# Patient Record
Sex: Female | Born: 1980 | Race: White | Hispanic: No | Marital: Married | Smoking: Never smoker
Health system: Southern US, Community
[De-identification: ages and names within clinical notes are randomized; demographics above are authoritative.]

## PROBLEM LIST (undated history)

## (undated) HISTORY — PX: DILATION AND CURETTAGE OF UTERUS: SHX78

## (undated) HISTORY — PX: WISDOM TOOTH EXTRACTION: SHX21

---
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
## 2001-08-23 ENCOUNTER — Other Ambulatory Visit: Admission: RE | Admit: 2001-08-23 | Discharge: 2001-08-23 | Payer: Self-pay | Admitting: Obstetrics and Gynecology

## 2002-10-01 ENCOUNTER — Other Ambulatory Visit: Admission: RE | Admit: 2002-10-01 | Discharge: 2002-10-01 | Payer: Self-pay | Admitting: Obstetrics & Gynecology

## 2002-10-01 ENCOUNTER — Other Ambulatory Visit: Admission: RE | Admit: 2002-10-01 | Discharge: 2002-10-01 | Payer: Self-pay | Admitting: Obstetrics and Gynecology

## 2003-01-05 ENCOUNTER — Encounter: Payer: Self-pay | Admitting: Emergency Medicine

## 2003-01-05 ENCOUNTER — Emergency Department (HOSPITAL_COMMUNITY): Admission: EM | Admit: 2003-01-05 | Discharge: 2003-01-05 | Payer: Self-pay | Admitting: Emergency Medicine

## 2003-06-04 ENCOUNTER — Inpatient Hospital Stay (HOSPITAL_COMMUNITY): Admission: AD | Admit: 2003-06-04 | Discharge: 2003-06-07 | Payer: Self-pay | Admitting: *Deleted

## 2004-08-11 ENCOUNTER — Ambulatory Visit (HOSPITAL_COMMUNITY): Admission: RE | Admit: 2004-08-11 | Discharge: 2004-08-11 | Payer: Self-pay | Admitting: Obstetrics & Gynecology

## 2004-10-27 ENCOUNTER — Observation Stay (HOSPITAL_COMMUNITY): Admission: AD | Admit: 2004-10-27 | Discharge: 2004-10-28 | Payer: Self-pay | Admitting: Obstetrics

## 2004-12-23 ENCOUNTER — Inpatient Hospital Stay (HOSPITAL_COMMUNITY): Admission: AD | Admit: 2004-12-23 | Discharge: 2004-12-26 | Payer: Self-pay | Admitting: Obstetrics & Gynecology

## 2007-10-08 ENCOUNTER — Ambulatory Visit (HOSPITAL_COMMUNITY): Admission: AD | Admit: 2007-10-08 | Discharge: 2007-10-08 | Payer: Self-pay | Admitting: Obstetrics and Gynecology

## 2007-10-08 ENCOUNTER — Encounter (INDEPENDENT_AMBULATORY_CARE_PROVIDER_SITE_OTHER): Payer: Self-pay | Admitting: Obstetrics and Gynecology

## 2008-09-26 ENCOUNTER — Other Ambulatory Visit: Admission: RE | Admit: 2008-09-26 | Discharge: 2008-09-26 | Payer: Self-pay | Admitting: Obstetrics & Gynecology

## 2009-01-22 ENCOUNTER — Encounter (INDEPENDENT_AMBULATORY_CARE_PROVIDER_SITE_OTHER): Payer: Self-pay | Admitting: Interventional Radiology

## 2009-01-22 ENCOUNTER — Other Ambulatory Visit: Admission: RE | Admit: 2009-01-22 | Discharge: 2009-01-22 | Payer: Self-pay | Admitting: Interventional Radiology

## 2009-01-22 ENCOUNTER — Encounter: Admission: RE | Admit: 2009-01-22 | Discharge: 2009-01-22 | Payer: Self-pay | Admitting: Endocrinology

## 2010-02-09 ENCOUNTER — Emergency Department (HOSPITAL_COMMUNITY): Admission: EM | Admit: 2010-02-09 | Discharge: 2010-02-09 | Payer: Self-pay | Admitting: Emergency Medicine

## 2010-02-24 ENCOUNTER — Emergency Department (HOSPITAL_COMMUNITY): Admission: EM | Admit: 2010-02-24 | Discharge: 2010-02-24 | Payer: Self-pay | Admitting: Emergency Medicine

## 2010-06-29 ENCOUNTER — Inpatient Hospital Stay (HOSPITAL_COMMUNITY): Admission: AD | Admit: 2010-06-29 | Discharge: 2010-07-01 | Payer: Self-pay | Admitting: Obstetrics and Gynecology

## 2010-09-11 ENCOUNTER — Encounter: Admission: RE | Admit: 2010-09-11 | Discharge: 2010-09-11 | Payer: Self-pay | Admitting: Internal Medicine

## 2010-12-06 ENCOUNTER — Encounter: Payer: Self-pay | Admitting: Endocrinology

## 2010-12-06 ENCOUNTER — Encounter: Payer: Self-pay | Admitting: Obstetrics & Gynecology

## 2011-01-28 LAB — CBC
HCT: 35.3 % — ABNORMAL LOW (ref 36.0–46.0)
Hemoglobin: 12 g/dL (ref 12.0–15.0)
MCHC: 34.1 g/dL (ref 30.0–36.0)
MCHC: 34.1 g/dL (ref 30.0–36.0)
MCV: 89.4 fL (ref 78.0–100.0)
Platelets: 215 10*3/uL (ref 150–400)
Platelets: 244 10*3/uL (ref 150–400)
RBC: 3.71 MIL/uL — ABNORMAL LOW (ref 3.87–5.11)
RBC: 3.95 MIL/uL (ref 3.87–5.11)
RDW: 15.1 % (ref 11.5–15.5)
WBC: 6.3 10*3/uL (ref 4.0–10.5)
WBC: 9.6 10*3/uL (ref 4.0–10.5)

## 2011-01-28 LAB — RPR: RPR Ser Ql: NONREACTIVE

## 2011-03-30 NOTE — H&P (Signed)
NAME:  Ashley Proctor, ARLEN NO.:  [id number]   MEDICAL RECORD NO.:  [id number]           PATIENT TYPE:   LOCATION:                                FACILITY:  [facility]   PHYSICIAN:  Osborn Coho, M.D.   DATE OF BIRTH:  12-May-1981   DATE OF ADMISSION:  DATE OF DISCHARGE:                              HISTORY & PHYSICAL   HISTORY OF PRESENT ILLNESS:  Ms. Ashley Proctor is a 30 year old married white  female para 4-0-1-4 who presents for a D&C because of an abnormal  pregnancy.  The patient initially presented to [facility]  on August 30, 2007 for a new OB care but developed first trimester  bleeding and had a pelvic ultrasound.  The pelvic ultrasound done on  October 20 showed a 8-week gestational sac with an enlarged yolk sac of  1 cm but no fetal pole or fetal heart rate.  The sac was noted to be  collapsing.  Her quantitative HCG at that time was 30,419.1.  She had a  blood type that was O+ and her gonorrhea and chlamydia cultures were  both negative.  The patient had a follow-up quantitative HCG on October  28 which was 1679.1 and on September 21, 2007 which was 492.5.  The  patient went on to report a heavy bleeding episode which lasted for 3  days starting on October 28 during which time she change a pad every  hour, and this was followed by 7 days of what she described as a  menstrual-like flow.  Initially, her cramping was intense, but it later  diminished.  On October 03, 2007, the patient had another quantitative  HCG, and her level was noted to have increased to 806.4.  The patient  was again given an ultrasound on November 21 which showed a mass of  mixed echogenicity filling the gestational sac, and there was also  positive color Doppler flow noted.  Ovaries were within normal limits  bilaterally.  There was no free fluid in the cul-de-sac, and no other  pelvic masses were noted.  A quantitative HCG was drawn and is currently  pending.  The patient denies  any bleeding since her initial episode  which ended 2 weeks ago.  She has not had any unprotected intercourse  since her bleeding stopped but admits to one occasion where a condom  came off.  The patient has had a single fleeting pain in her lower  pelvic area 1 week ago (right lower quadrant), but no further bleeding,  fever, nausea, vomiting, diarrhea, urinary tract symptoms or vaginitis  symptoms.  The patient is scheduled to have a repeat quantitative HCG  and pelvic ultrasound on November 23, and if her findings at that time  remain consistent with her most recent ultrasound, then the patient will  proceed with a D&C.   PAST MEDICAL HISTORY:  OB HISTORY:  Gravida 5, para 4-0-1-4.  The  patient has a history of preterm labor.  GYN HISTORY:  Menarche at 30 years old, last menstrual period  approximately June 22, 2007.  She uses condoms for  contraception.  Denies any history of sexually transmitted diseases or abnormal Pap  smears.  The patient's last normal Pap smear was 2 years ago.  MEDICAL HISTORY:  Anemia and wrist fracture.  SURGICAL HISTORY:  Is negative.   FAMILY HISTORY:  Liver cancer and bipolar disorder.   SOCIAL HISTORY:  The patient is married, and she is a Consulting civil engineer.   HABITS:  She does not use alcohol or tobacco.   CURRENT MEDICATIONS:  None.   She has no known drug allergies.   REVIEW OF SYSTEMS:  The patient denies chest pain, shortness of breath,  headache, vision changes, and except as mentioned in history of present  illness, the patient's review of systems is otherwise negative.   PHYSICAL EXAMINATION:  Blood pressure 120/90.  Weight is 162-1/2 pounds.  GENERAL EXAM:  Abdomen is without tenderness, masses, or organomegaly.  BACK:  No CVA tenderness.  PELVIC EXAM:  EGBUS is normal.  Vagina is normal.  Cervix is nontender  without lesions.  Uterus appears normal size, shape and consistency  without tenderness.  Adnexa without tenderness or masses.    IMPRESSION:  Incomplete abortion versus abnormal pregnancy.   DISPOSITION:  A discussion was held with the patient regarding the  findings of her quantitative HCGs and pelvic ultrasound which are  consistent with an abnormal pregnancy, with one possibility being an  incomplete abortion.  The patient has consented to proceed with a D&C at  [facility] on November 23, should her follow-up  ultrasound and quantitative HCG continued to confirm these previous  findings.  The patient understands the risks associated with this  procedure which include reaction to anesthesia, damage to adjacent  organs, infection, excessive bleeding and the possibility of scarring of  the endometrium.  The patient has consented to proceed with a D&C.      [place] J. Adline Peals.      Osborn Coho, M.D.  Electronically Signed    EJP/MEDQ  D:  10/06/2007  T:  10/07/2007  Job:  [id number]

## 2011-03-30 NOTE — Op Note (Signed)
NAME:  Ashley Proctor, Ashley Proctor NO.:  [id number]   MEDICAL RECORD NO.:  [id number]          PATIENT TYPE:  [facility]   LOCATION:  [facility]                          FACILITY:  [facility]   PHYSICIAN:  Osborn Coho, M.D.   DATE OF BIRTH:  05-May-1981   DATE OF PROCEDURE:  10/08/2007  DATE OF DISCHARGE:                               OPERATIVE REPORT   PREOPERATIVE DIAGNOSIS:  Retained products of conception.   POSTOPERATIVE DIAGNOSIS:  Retained products of conception.   PROCEDURE:  Suction, dilation and curettage/dilatation and evacuation.   ATTENDING:  Osborn Coho, M.D.   ANESTHESIA:  General via LMA.   SPECIMENS TO PATHOLOGY:  Products of conception.   FLUIDS:  500 mL.   URINE OUTPUT:  Greater than 500 mL via straight cath.   ESTIMATED BLOOD LOSS:  Minimal.   COMPLICATIONS:  None.   PROCEDURE:  The patient was taken to the operating room after the risks,  benefits and alternatives were reviewed with the patient.  The patient  verbalized understanding and consent signed and witnessed.  The patient  was placed under general anesthesia and prepped and draped in the normal  sterile fashion in the dorsal lithotomy position.  A bivalve speculum  placed in the patient's vagina and a paracervical block administered  using a total of 9 mL of 2% lidocaine.  The anterior lip of the cervix  was grasped with a single-tooth tenaculum and the uterus was sounded to  approximate 8.5 cm.  The cervix was dilated for passage of the size 8  suction curette.  Suction curettage was performed and products of  conception appeared to returned with minimal to moderate amount.  Sharp  curettage was performed and a gritty texture noted.  Suction curettage  was performed once again to remove any remaining debris.  The tenaculum  was removed.  There was some bleeding at the right tenaculum site which  was made hemostatic with silver nitrate.  Count was correct.  Speculum  was removed as well.  The patient  tolerated the procedure well and was  awaiting return to the recovery room in good condition.      Osborn Coho, M.D.  Electronically Signed    AR/MEDQ  D:  10/08/2007  T:  10/09/2007  Job:  [id number]

## 2011-07-30 ENCOUNTER — Other Ambulatory Visit: Payer: Self-pay | Admitting: Internal Medicine

## 2011-07-30 DIAGNOSIS — E049 Nontoxic goiter, unspecified: Secondary | ICD-10-CM

## 2011-08-03 ENCOUNTER — Ambulatory Visit
Admission: RE | Admit: 2011-08-03 | Discharge: 2011-08-03 | Disposition: A | Discharge status: Home / Self Care | Payer: Medicaid Other | Source: Ambulatory Visit | Attending: Internal Medicine | Admitting: Internal Medicine

## 2011-08-03 DIAGNOSIS — E049 Nontoxic goiter, unspecified: Secondary | ICD-10-CM

## 2011-08-24 LAB — CBC
HCT: 35.5 — ABNORMAL LOW
Hemoglobin: 12.5
MCHC: 35.3
MCV: 88.8
Platelets: 231
RDW: 13.5

## 2012-12-14 ENCOUNTER — Other Ambulatory Visit: Payer: Self-pay | Admitting: Internal Medicine

## 2012-12-14 DIAGNOSIS — E049 Nontoxic goiter, unspecified: Secondary | ICD-10-CM

## 2012-12-19 ENCOUNTER — Other Ambulatory Visit: Payer: Medicaid Other

## 2012-12-20 ENCOUNTER — Ambulatory Visit
Admission: RE | Admit: 2012-12-20 | Discharge: 2012-12-20 | Disposition: A | Discharge status: Home / Self Care | Payer: Medicaid Other | Source: Ambulatory Visit | Attending: Internal Medicine | Admitting: Internal Medicine

## 2012-12-20 DIAGNOSIS — E049 Nontoxic goiter, unspecified: Secondary | ICD-10-CM

## 2013-11-29 ENCOUNTER — Other Ambulatory Visit: Payer: Self-pay | Admitting: Dermatology

## 2015-07-15 ENCOUNTER — Other Ambulatory Visit: Payer: Self-pay | Admitting: Internal Medicine

## 2015-07-15 DIAGNOSIS — E042 Nontoxic multinodular goiter: Secondary | ICD-10-CM

## 2015-07-22 ENCOUNTER — Ambulatory Visit
Admission: RE | Admit: 2015-07-22 | Discharge: 2015-07-22 | Disposition: A | Discharge status: Home / Self Care | Payer: Medicaid Other | Source: Ambulatory Visit | Attending: Internal Medicine | Admitting: Internal Medicine

## 2015-07-22 DIAGNOSIS — E042 Nontoxic multinodular goiter: Secondary | ICD-10-CM

## 2015-10-05 ENCOUNTER — Emergency Department (HOSPITAL_COMMUNITY)
Admission: EM | Admit: 2015-10-05 | Discharge: 2015-10-05 | Disposition: A | Discharge status: Home / Self Care | Payer: Medicaid Other | Attending: Emergency Medicine | Admitting: Emergency Medicine

## 2015-10-05 ENCOUNTER — Encounter (HOSPITAL_COMMUNITY): Payer: Self-pay | Admitting: Emergency Medicine

## 2015-10-05 ENCOUNTER — Emergency Department (HOSPITAL_COMMUNITY): Payer: Medicaid Other

## 2015-10-05 DIAGNOSIS — Y998 Other external cause status: Secondary | ICD-10-CM | POA: Insufficient documentation

## 2015-10-05 DIAGNOSIS — S6992XA Unspecified injury of left wrist, hand and finger(s), initial encounter: Secondary | ICD-10-CM | POA: Diagnosis present

## 2015-10-05 DIAGNOSIS — S0081XA Abrasion of other part of head, initial encounter: Secondary | ICD-10-CM | POA: Diagnosis not present

## 2015-10-05 DIAGNOSIS — S62631A Displaced fracture of distal phalanx of left index finger, initial encounter for closed fracture: Secondary | ICD-10-CM | POA: Insufficient documentation

## 2015-10-05 DIAGNOSIS — S60411A Abrasion of left index finger, initial encounter: Secondary | ICD-10-CM | POA: Insufficient documentation

## 2015-10-05 DIAGNOSIS — Y9389 Activity, other specified: Secondary | ICD-10-CM | POA: Insufficient documentation

## 2015-10-05 DIAGNOSIS — S0083XA Contusion of other part of head, initial encounter: Secondary | ICD-10-CM | POA: Insufficient documentation

## 2015-10-05 DIAGNOSIS — S62639A Displaced fracture of distal phalanx of unspecified finger, initial encounter for closed fracture: Secondary | ICD-10-CM

## 2015-10-05 DIAGNOSIS — Y9289 Other specified places as the place of occurrence of the external cause: Secondary | ICD-10-CM | POA: Diagnosis not present

## 2015-10-05 DIAGNOSIS — T07XXXA Unspecified multiple injuries, initial encounter: Secondary | ICD-10-CM

## 2015-10-05 LAB — POC URINE PREG, ED: Preg Test, Ur: NEGATIVE

## 2015-10-05 MED ORDER — CYCLOBENZAPRINE HCL 10 MG PO TABS: 10.0000 mg | ORAL_TABLET | Freq: Three times a day (TID) | ORAL | Status: AC | PRN

## 2015-10-05 MED ORDER — BACITRACIN ZINC 500 UNIT/GM EX OINT: 1.0000 "application " | TOPICAL_OINTMENT | Freq: Two times a day (BID) | CUTANEOUS | Status: AC

## 2015-10-05 MED ORDER — BACITRACIN ZINC 500 UNIT/GM EX OINT
TOPICAL_OINTMENT | Freq: Two times a day (BID) | CUTANEOUS | Status: DC
  Administered 2015-10-05: 1 via TOPICAL

## 2015-10-05 MED ORDER — IBUPROFEN 800 MG PO TABS: 800.0000 mg | ORAL_TABLET | Freq: Three times a day (TID) | ORAL | Status: AC | PRN

## 2015-10-05 NOTE — Discharge Instructions (Signed)
Read the information below.  Use the prescribed medication as directed.  Please discuss all new medications with your pharmacist.  You may return to the Emergency Department at any time for worsening condition or any new symptoms that concern you.   If you develop redness, swelling, pus draining from the wound, or fevers greater than 100.4, return to the ER immediately for a recheck.   Contusion A contusion is a deep bruise. Contusions are the result of a blunt injury to tissues and muscle fibers under the skin. The injury causes bleeding under the skin. The skin overlying the contusion may turn blue, purple, or yellow. Minor injuries will give you a painless contusion, but more severe contusions may stay painful and swollen for a few weeks.  CAUSES  This condition is usually caused by a blow, trauma, or direct force to an area of the body. SYMPTOMS  Symptoms of this condition include:  Swelling of the injured area.  Pain and tenderness in the injured area.  Discoloration. The area may have redness and then turn blue, purple, or yellow. DIAGNOSIS  This condition is diagnosed based on a physical exam and medical history. An X-ray, CT scan, or MRI may be needed to determine if there are any associated injuries, such as broken bones (fractures). TREATMENT  Specific treatment for this condition depends on what area of the body was injured. In general, the best treatment for a contusion is resting, icing, applying pressure to (compression), and elevating the injured area. This is often called the RICE strategy. Over-the-counter anti-inflammatory medicines may also be recommended for pain control.  HOME CARE INSTRUCTIONS   Rest the injured area.  If directed, apply ice to the injured area:  Put ice in a plastic bag.  Place a towel between your skin and the bag.  Leave the ice on for 20 minutes, 2-3 times per day.  If directed, apply light compression to the injured area using an elastic  bandage. Make sure the bandage is not wrapped too tightly. Remove and reapply the bandage as directed by your health care provider.  If possible, raise (elevate) the injured area above the level of your heart while you are sitting or lying down.  Take over-the-counter and prescription medicines only as told by your health care provider. SEEK MEDICAL CARE IF:  Your symptoms do not improve after several days of treatment.  Your symptoms get worse.  You have difficulty moving the injured area. SEEK IMMEDIATE MEDICAL CARE IF:   You have severe pain.  You have numbness in a hand or foot.  Your hand or foot turns pale or cold.   This information is not intended to replace advice given to you by your health care provider. Make sure you discuss any questions you have with your health care provider.   Document Released: 08/11/2005 Document Revised: 07/23/2015 Document Reviewed: 03/19/2015 Elsevier Interactive Patient Education 2016 Elsevier Inc.  Finger Fracture Fractures of fingers are breaks in the bones of the fingers. There are many types of fractures. There are different ways of treating these fractures. Your health care provider will discuss the best way to treat your fracture. CAUSES Traumatic injury is the main cause of broken fingers. These include:  Injuries while playing sports.  Workplace injuries.  Falls. RISK FACTORS Activities that can increase your risk of finger fractures include:  Sports.  Workplace activities that involve machinery.  A condition called osteoporosis, which can make your bones less dense and cause them to fracture  more easily. SIGNS AND SYMPTOMS The main symptoms of a broken finger are pain and swelling within 15 minutes after the injury. Other symptoms include:  Bruising of your finger.  Stiffness of your finger.  Numbness of your finger.  Exposed bones (compound fracture) if the fracture is severe. DIAGNOSIS  The best way to diagnose a  broken bone is with X-ray imaging. Additionally, your health care provider will use this X-ray image to evaluate the position of the broken finger bones.  TREATMENT  Finger fractures can be treated with:   Nonreduction--This means the bones are in place. The finger is splinted without changing the positions of the bone pieces. The splint is usually left on for about a week to 10 days. This will depend on your fracture and what your health care provider thinks.  Closed reduction--The bones are put back into position without using surgery. The finger is then splinted.  Open reduction and internal fixation--The fracture site is opened. Then the bone pieces are fixed into place with pins or some type of hardware. This is seldom required. It depends on the severity of the fracture. HOME CARE INSTRUCTIONS   Follow your health care provider's instructions regarding activities, exercises, and physical therapy.  Only take over-the-counter or prescription medicines for pain, discomfort, or fever as directed by your health care provider. SEEK MEDICAL CARE IF: You have pain or swelling that limits the motion or use of your fingers. SEEK IMMEDIATE MEDICAL CARE IF:  Your finger becomes numb. MAKE SURE YOU:   Understand these instructions.  Will watch your condition.  Will get help right away if you are not doing well or get worse.   This information is not intended to replace advice given to you by your health care provider. Make sure you discuss any questions you have with your health care provider.   Document Released: 02/13/2001 Document Revised: 08/22/2013 Document Reviewed: 06/13/2013 Elsevier Interactive Patient Education 2016 [employer].       Emergency Department Resource Guide 1) Find a Doctor and Pay Out of Pocket Although you won't have to find out who is covered by your insurance plan, it is a good idea to ask around and get recommendations. You will then need to call the  office and see if the doctor you have chosen will accept you as a new patient and what types of options they offer for patients who are self-pay. Some doctors offer discounts or will set up payment plans for their patients who do not have insurance, but you will need to ask so you aren't surprised when you get to your appointment.  2) Contact Your Local Health Department Not all health departments have doctors that can see patients for sick visits, but many do, so it is worth a call to see if yours does. If you don't know where your local health department is, you can check in your phone book. The CDC also has a tool to help you locate your state's health department, and many state websites also have listings of all of their local health departments.  3) Find a Walk-in Clinic If your illness is not likely to be very severe or complicated, you may want to try a walk in clinic. These are popping up all over the country in pharmacies, drugstores, and shopping centers. They're usually staffed by nurse practitioners or physician assistants that have been trained to treat common illnesses and complaints. They're usually fairly quick and inexpensive. However, if you have serious medical  issues or chronic medical problems, these are probably not your best option.  No Primary Care Doctor: - Call Health Connect at  [phone] - they can help you locate a primary care doctor that  accepts your insurance, provides certain services, etc. - Physician Referral Service- [phone]  Chronic Pain Problems: Organization         Address  Phone   Notes  [facility]  [phone] Patients need to be referred by their primary care doctor.   Medication Assistance: Organization         Address  Phone   Notes  [facility] Medication [facility] [address] [place] [place], [place] [address] [phone] --Must be a resident of [facility] -- Must have NO insurance coverage  whatsoever (no Medicaid/ Medicare, etc.) -- The pt. MUST have a primary care doctor that directs their care regularly and follows them in the community   [facility]  [phone]   [employer]  [phone]    Agencies that provide inexpensive medical care: Organization         Address  Phone   Notes  [facility]  [phone]   [facility]    [phone]   [facility] [address] [place], [place] [address] [phone]   [facility] [address] [place] [place] [phone]   [facility]anned Parenthood    [phone]   [facility]    [phone]   [facility]  [address], [place] Phone:  [phone], Fax:  [phone] Hours of Operation:  9 am - 6 pm, M-F.  Also accepts Medicaid/Medicare and self-pay.  [facility]  [address], [place] Phone: [phone], Fax: [phone]. Hours of Operation:  8:30 am - 5:30 pm, M-F.  Also accepts Medicaid and self-pay.  [facility] [place] [address] [place] Phone: [phone]   [facility] [address] Natasha Bence [place], [place] [phone], Ext. [phone] Mondays & Thursdays: 7-9 AM.  First 15 patients are seen on a first come, first serve basis.    Medicaid-accepting [facility] Providers:  Organization         Address  Phone   Notes  [facility] [address], [place] [phone] Also accepts self-pay patients.  [facility] [address] Laurell Josephs [place], [place]  [phone]   [facility] [address], [place] [phone]   [facility] [address], [place] [phone]   Renaye Rakers [address], [place]   [phone] Only accepts [place] Access [place] patients after they have their name applied  to their card.   Self-Pay (no insurance) in [employer]:  Organization         Address  Phone   Notes  Sickle Cell Patients, [facility] [address] [place], [place] [phone]   [facility] [address] [place], [place] [phone]   [facility] [place]  [address], [place] [phone]   [facility]/Dr. Osei-Bonsu  [address], [place] or [address], [place] [phone] Phone number for both [place] and [place] locations is the  same.  [facility] [address], [place] [phone]   [facility] [address], [place] or [address] [phone]   [facility] [address], [place] [phone], phone; [phone], fax Sees patients 1st and 3rd Saturday of every month.  Must not qualify for public or private insurance (i.e. Medicaid, Medicare, [place] Health Choice, Veterans' Benefits)  Household income should be no more than 200% of the poverty level The clinic cannot treat you if you are pregnant or think you are pregnant  Sexually transmitted diseases are not treated at the clinic.    Dental Care: Organization         Address  Phone  Notes  [facility] Department of [facility] [address] [place], [place] [phone] Accepts children up to age 25 who are enrolled in [place] or [place] Health Choice; pregnant women with a Medicaid card; and children who have applied for Medicaid or [place] Health Choice, but were declined, whose parents can pay a reduced fee at time of service.  [facility] Department of [facility]  [address], [place] [phone] Accepts children up to age 58 who are enrolled in [place] or [place] Health Choice; pregnant women with a Medicaid card; and children who have applied for Medicaid or [place]  Health Choice, but were declined, whose parents can pay a reduced fee at time of service.  [facility]  [address] [place], [place] [phone] Patients are seen by appointment only. Walk-ins are not accepted. [facility] will see patients 58 years of age and older. Monday - Tuesday (8am-5pm) Most Wednesdays (8:30-5pm) $30 per visit, cash only  [facility] Adult Dental Access [facility]  [address], [facility] [phone] Patients are seen by appointment only. Walk-ins are not accepted. [facility] will see patients 54 years of age and older. One Wednesday Evening (Monthly: Volunteer Based).  $30 per visit, cash only  [employer]  [phone] for adults; Children under age 25, call Graduate Pediatric Dentistry at [phone]. Children aged 56-14, please call [phone] to request a pediatric application.  Dental services are provided in all areas of dental care including fillings, crowns and bridges, complete and partial dentures, implants, gum treatment, root canals, and extractions. Preventive care is also provided. Treatment is provided to both adults and children. Patients are selected via a lottery and there is often a waiting list.   [facility] [address] [place]  [phone] [email or web address]   [facility] [address] [place], [place] [phone], Ext. [phone] Second and Fourth Thursday of each month, opens at 6:30 AM; Clinic ends at 9 AM.  Patients are seen on a first-come first-served basis, and a limited number are seen during each clinic.   [facility]  [address] [place], [place] [phone]   Eligibility Requirements You must have lived in [place], [place], or [place] counties for at least the last three months.   You cannot be eligible for state or federal sponsored [place], including [employer], [place], or [employer].    You generally cannot be eligible for healthcare insurance through your employer.    How to apply: Eligibility screenings are held every Tuesday and Wednesday afternoon from 1:00 pm until 4:00 pm. You do not need an appointment for  the interview!  [facility] [address] [place], [place] [phone]   [facility]  [phone]   [facility] Health Department  [phone]   [facility] Health Department  [phone]    Behavioral Health Resources in the Community: Intensive Outpatient Programs Organization         Address  Phone  Notes  [facility] [address] [place], [place] [phone]   [facility] Outpatient [address] [place], [place] [phone]   [facility] [address], [place], [place]  [phone]   [facility] Mental Health [address]  [place], [place] [phone] or [phone]   Substance Abuse Resources Organization         Address  Phone  Notes  [facility]  [phone]   [facility]  [phone]   [facility]  [phone]   Floydene Flock  [phone]   Residential & Outpatient Substance Abuse Program  [phone]   Psychological Services Organization         Address  Phone  Notes  [facility]  [phone]   [facility]  [phone]   [facility] [address] [place] [phone] or [phone]    Mobile Crisis Teams Organization         Address  Phone  Notes  [facility], Mobile Crisis Care Unit  [phone]   [facility]  [address] [place], [place] [phone]   Doristine Locks [address] [place] [phone]    Self-Help/Support Groups Organization         Address  Phone             Notes  [facility] [place] - variety of support groups  336- I7437963 Call  for more information  Narcotics Anonymous (NA), [facility] [address], [employer] [place]  2 meetings at this location   Statistician         Address  Phone  Notes  [facility] [address],    [place]  [phone]   [facility]  [address] [place] [address], [place], [place] [phone]   [facility] [address] [place], [place] [phone] Admissions: 8am-3pm M-F  [facility] [address],    [place], [place] [phone]   [facility] [address] [place], [place], [place] [phone]   [facility] [address],  [place], [place] [phone]   [facility] - Intensive Outpatient [address]ure[address], [place], [place] [phone]   [facility] ([facility] [address] [place]  [place], [place] [phone] or [phone]   [facility] ([facility]) [address], [place], [place] [phone] Accepts Medicaid  [facility] [place] [address],  [place] [phone] Substance Abuse/Addiction Treatment   [facility] Organization         Address  Phone  Notes  [facility]  [phone]   Angie Fava, PhD [address] Ervin Knack [place], [place]   [phone] or [phone]   [facility]   [address] [place], [place] [phone]   [facility] [address] [place], [place] [phone] Insurance/Medicaid/sponsorship through [employer] and Families [address]  [place], [place] [phone] [place], [place] [phone]    Dr. Adele Schilder  [phone]   [facility] Dept. 1) [address] [place] 2) [place] 3)  [address], [place]  [phone]  [phone]   [place] [phone] or [phone] (After Hours)
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
## 2016-06-01 ENCOUNTER — Other Ambulatory Visit: Payer: Self-pay | Admitting: Internal Medicine

## 2016-06-01 DIAGNOSIS — E042 Nontoxic multinodular goiter: Secondary | ICD-10-CM

## 2017-07-04 IMAGING — US US SOFT TISSUE HEAD/NECK
1 series · 13 of 25 positions shown · non-contrast
Comparison: 12/20/2012 and earlier studies

CLINICAL DATA: Multinodular goiter. Previous FNA biopsy of dominant
right lesion 01/22/2009.

EXAM:
THYROID ULTRASOUND
TECHNIQUE: Ultrasound examination of the thyroid gland and adjacent soft
tissues was performed.

[Series 1: us soft tissue head/neck · 0.08mm/px · 13 of 78 slices shown]
[im 1/78]
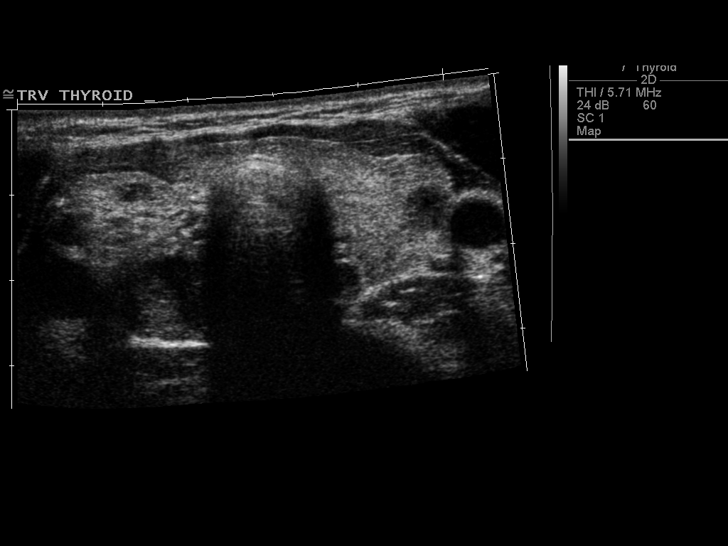
[im 7/78]
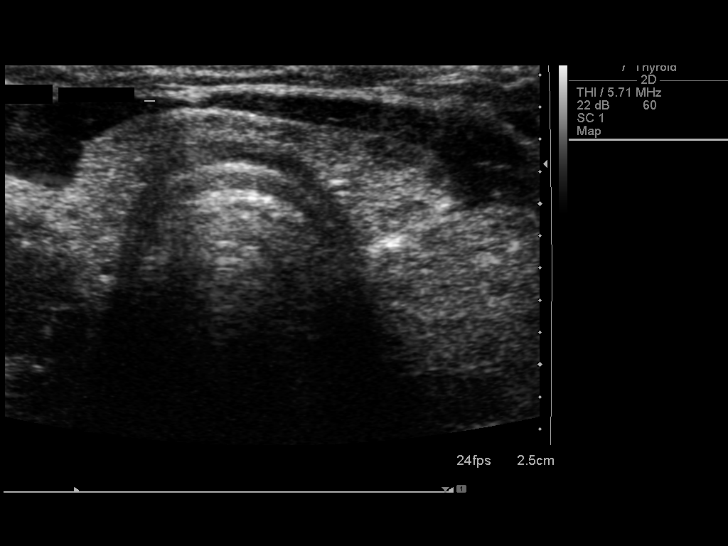
[im 13/78]
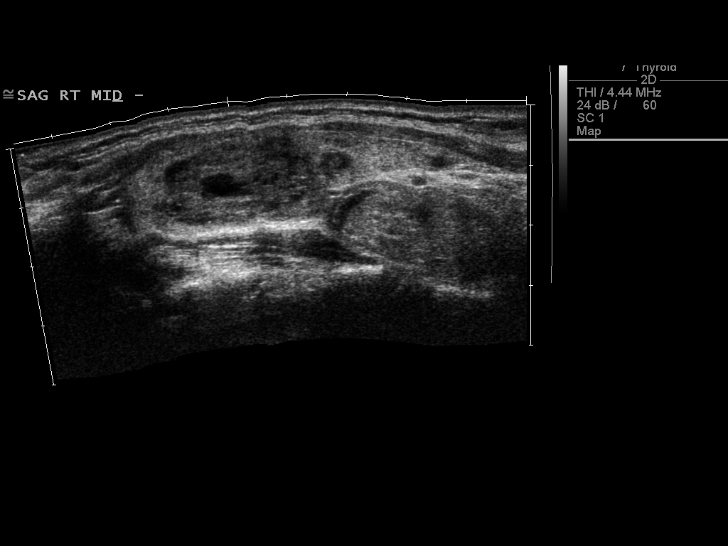
[im 20/78]
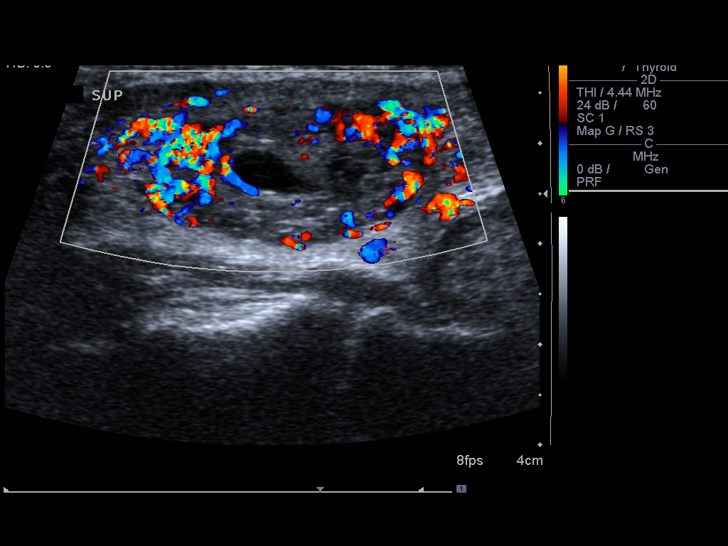
[im 26/78]
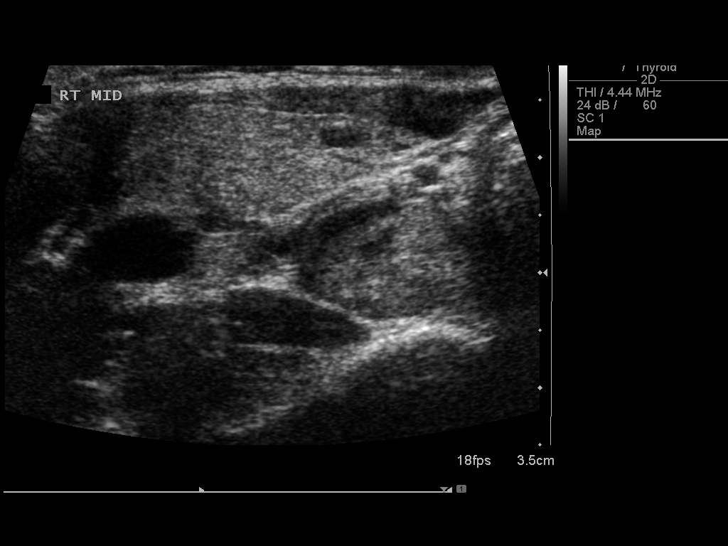
[im 33/78]
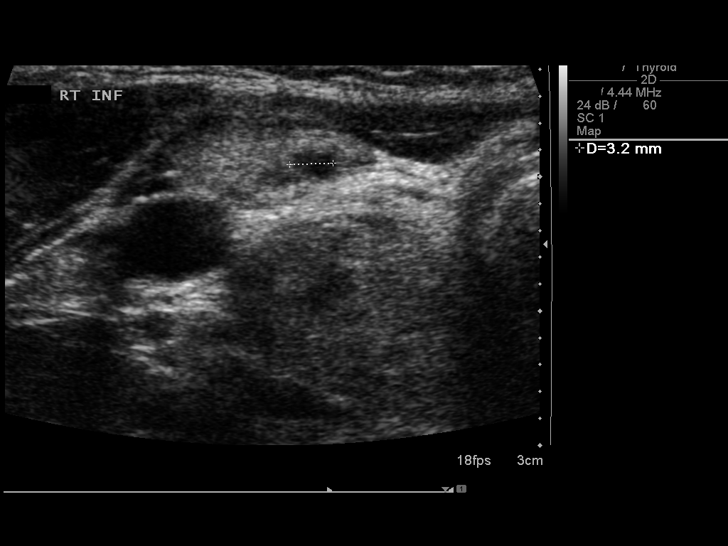
[im 39/78]
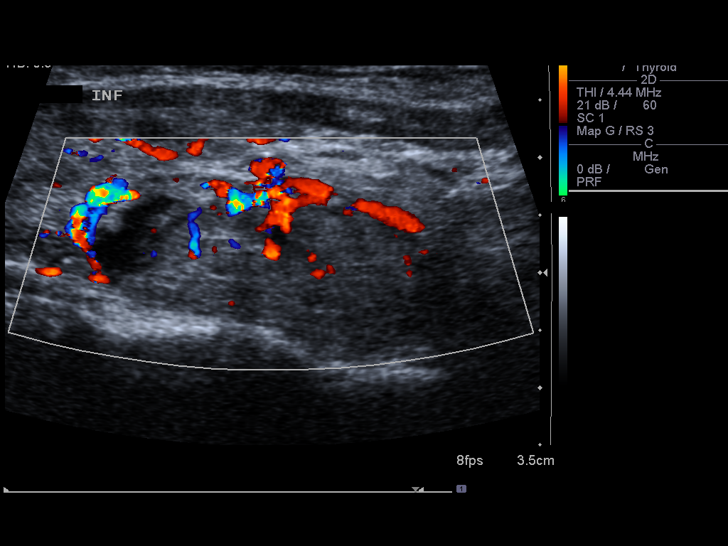
[im 45/78]
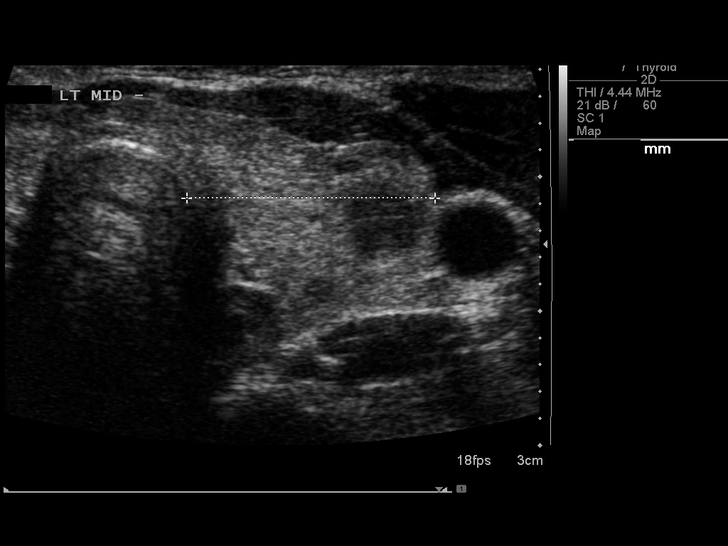
[im 52/78]
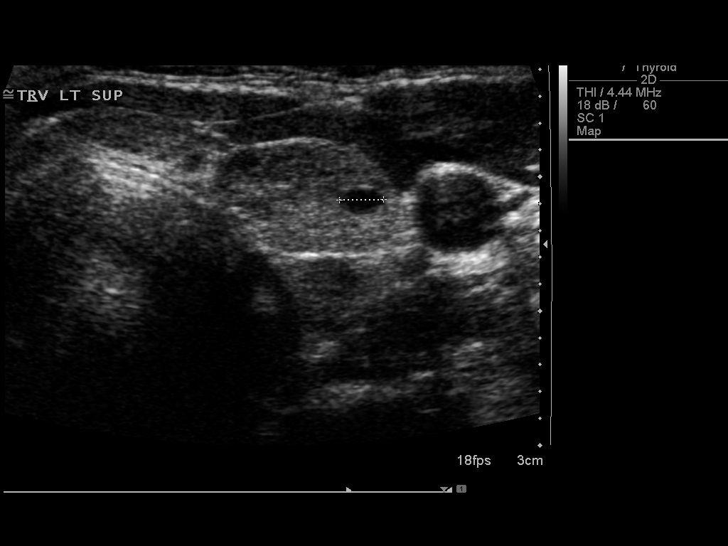
[im 58/78]
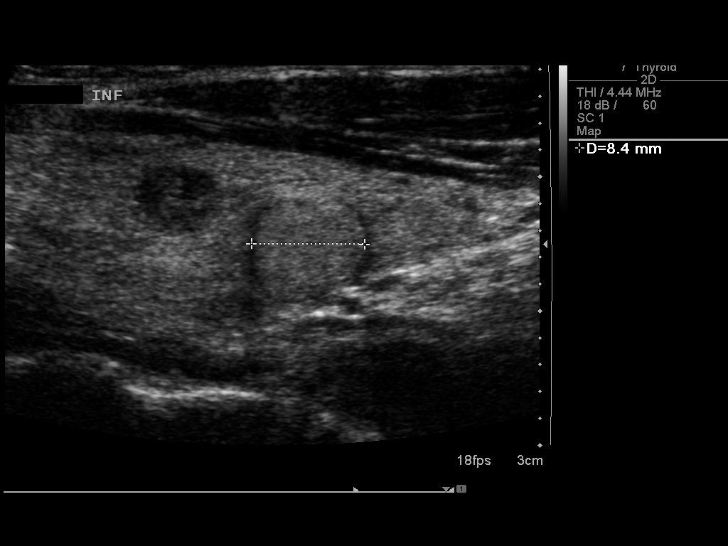
[im 65/78]
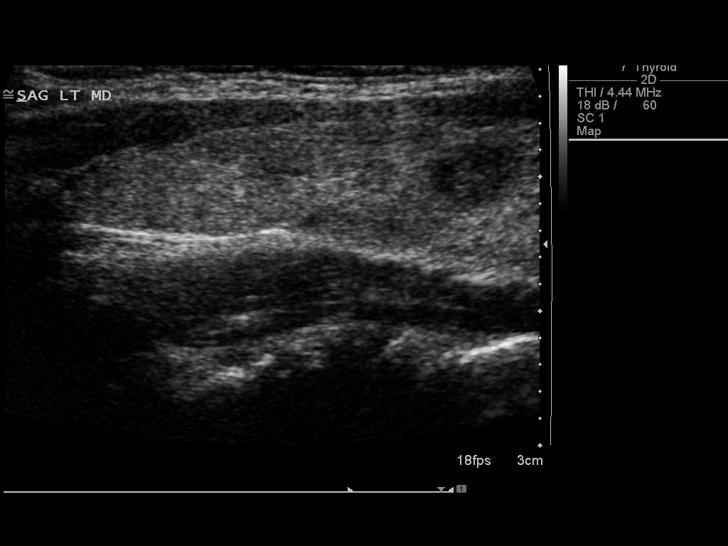
[im 71/78]
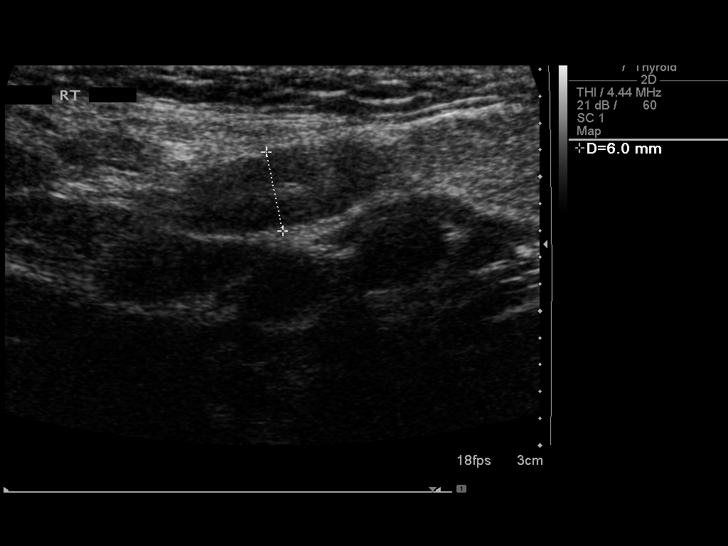
[im 78/78]
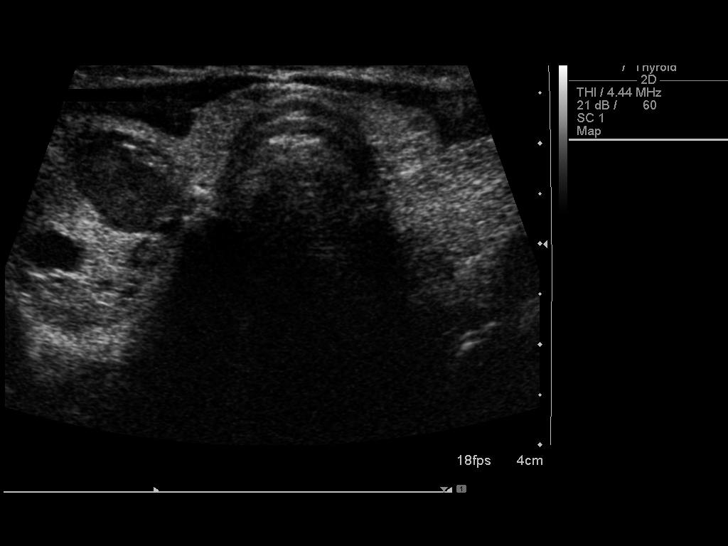

[13 of 25 positions shown; findings below may reference images not displayed]

FINDINGS: Right thyroid lobe

Measurements: 66 x 17 x 27 mm. Heterogeneous echotexture with
multiple nodules. Complex 21 x 17 x 28 mm mostly solid nodule,
superior pole (previously 32 x 17 x 21). Hypoechoic 18 x 32 x 14 mm
Innumerable additional subcentimeter nodules.

Left thyroid lobe

Measurements: 59 x 14 x 19 mm. Multiple nodules. Largest 6 x 8 x8 mm
solid, inferior pole. 8 mm nodule, superior pole. Remainder less
than 6 mm.

Isthmus

Thickness: 3.6 mm.  Two  4 mm nodules.

Lymphadenopathy

None visualized.
IMPRESSION: 1. Thyromegaly with bilateral nodules. The dominant right lesion has
already been biopsied. The others do not meet current consensus
criteria for biopsy. Follow-up by clinical exam is recommended. If
patient has known risk factors for thyroid carcinoma, consider
follow-up ultrasound in 12 months. If patient is clinically
hyperthyroid, consider nuclear medicine thyroid uptake and scan.
This recommendation follows the consensus statement: Management of
Thyroid Nodules Detected as US: Society of Radiologists in
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
## 2017-11-14 ENCOUNTER — Ambulatory Visit (INDEPENDENT_AMBULATORY_CARE_PROVIDER_SITE_OTHER): Payer: Self-pay | Admitting: Physician Assistant

## 2019-10-03 ENCOUNTER — Ambulatory Visit: Payer: Self-pay | Admitting: Physician Assistant

## 2020-02-07 ENCOUNTER — Ambulatory Visit: Payer: Medicaid Other | Attending: Internal Medicine

## 2020-02-07 DIAGNOSIS — Z23 Encounter for immunization: Secondary | ICD-10-CM

## 2020-02-07 NOTE — Progress Notes (Signed)
   Covid-19 Vaccination Clinic  Name:  JESSI PITSTICK    MRN: [id number] DOB: 1981-11-12  02/07/2020  Ms. Kaine was observed post Covid-19 immunization for 15 minutes without incident. She was provided with Vaccine Information Sheet and instruction to access the V-Safe system.   Ms. Grigoryan was instructed to call 911 with any severe reactions post vaccine: Marland Kitchen Difficulty breathing  . Swelling of face and throat  . A fast heartbeat  . A bad rash all over body  . Dizziness and weakness   Immunizations Administered    Name Date Dose VIS Date Route   Pfizer COVID-19 Vaccine 02/07/2020  2:14 PM 0.3 mL 10/26/2019 Intramuscular   Manufacturer: [employer], [employer]   Lot: [id number]   NDC: [address]-9

## 2020-03-05 ENCOUNTER — Ambulatory Visit: Payer: Medicaid Other | Attending: Internal Medicine

## 2020-03-05 DIAGNOSIS — Z23 Encounter for immunization: Secondary | ICD-10-CM

## 2020-03-05 NOTE — Progress Notes (Signed)
   Covid-19 Vaccination Clinic  Name:  Ashley Proctor    MRN: [id number] DOB: 1981-10-17  03/05/2020  [name] was observed post Covid-19 immunization for 15 minutes without incident. She was provided with Vaccine Information Sheet and instruction to access the V-Safe system.   [name] was instructed to call 911 with any severe reactions post vaccine: Marland Kitchen Difficulty breathing  . Swelling of face and throat  . A fast heartbeat  . A bad rash all over body  . Dizziness and weakness   Immunizations Administered    Name Date Dose VIS Date Route   Pfizer COVID-19 Vaccine 03/05/2020  2:12 PM 0.3 mL 01/09/2019 Intramuscular   Manufacturer: [employer], [employer]   Lot: [id number]   NDC: [address]-9
